# Patient Record
Sex: Female | Born: 1955 | Race: White | Hispanic: No | Marital: Married | Smoking: Never smoker
Health system: Southern US, Community
[De-identification: ages and names within clinical notes are randomized; demographics above are authoritative.]

## PROBLEM LIST (undated history)

## (undated) DIAGNOSIS — M779 Enthesopathy, unspecified: Secondary | ICD-10-CM

## (undated) DIAGNOSIS — S93409A Sprain of unspecified ligament of unspecified ankle, initial encounter: Secondary | ICD-10-CM

## (undated) DIAGNOSIS — G43909 Migraine, unspecified, not intractable, without status migrainosus: Secondary | ICD-10-CM

## (undated) DIAGNOSIS — C801 Malignant (primary) neoplasm, unspecified: Secondary | ICD-10-CM

## (undated) HISTORY — DX: Malignant (primary) neoplasm, unspecified: C80.1

## (undated) HISTORY — PX: OTHER SURGICAL HISTORY: SHX169

## (undated) HISTORY — DX: Enthesopathy, unspecified: M77.9

## (undated) HISTORY — DX: Migraine, unspecified, not intractable, without status migrainosus: G43.909

## (undated) HISTORY — DX: Sprain of unspecified ligament of unspecified ankle, initial encounter: S93.409A

---

## 2003-10-12 HISTORY — PX: MELANOMA EXCISION: SHX5266

## 2010-04-10 DEATH — deceased

## 2011-01-24 ENCOUNTER — Emergency Department (HOSPITAL_BASED_OUTPATIENT_CLINIC_OR_DEPARTMENT_OTHER)
Admission: EM | Admit: 2011-01-24 | Discharge: 2011-01-24 | Disposition: A | Discharge status: Home / Self Care | Payer: BC Managed Care – HMO | Attending: Emergency Medicine | Admitting: Emergency Medicine

## 2011-01-24 DIAGNOSIS — R21 Rash and other nonspecific skin eruption: Secondary | ICD-10-CM | POA: Insufficient documentation

## 2011-01-24 DIAGNOSIS — T462X5A Adverse effect of other antidysrhythmic drugs, initial encounter: Secondary | ICD-10-CM | POA: Insufficient documentation

## 2011-01-24 DIAGNOSIS — I959 Hypotension, unspecified: Secondary | ICD-10-CM | POA: Insufficient documentation

## 2013-01-05 ENCOUNTER — Encounter: Payer: Self-pay | Admitting: Podiatry

## 2013-01-16 ENCOUNTER — Ambulatory Visit: Payer: Self-pay | Admitting: Podiatry

## 2013-06-28 ENCOUNTER — Ambulatory Visit (INDEPENDENT_AMBULATORY_CARE_PROVIDER_SITE_OTHER): Payer: BC Managed Care – PPO | Admitting: Internal Medicine

## 2013-06-28 ENCOUNTER — Encounter: Payer: Self-pay | Admitting: Internal Medicine

## 2013-06-28 VITALS — BP 119/74 | HR 74 | Temp 96.8°F | Resp 18 | Wt 152.0 lb

## 2013-06-28 DIAGNOSIS — Z780 Asymptomatic menopausal state: Secondary | ICD-10-CM | POA: Insufficient documentation

## 2013-06-28 DIAGNOSIS — K635 Polyp of colon: Secondary | ICD-10-CM | POA: Insufficient documentation

## 2013-06-28 DIAGNOSIS — N951 Menopausal and female climacteric states: Secondary | ICD-10-CM

## 2013-06-28 DIAGNOSIS — C439 Malignant melanoma of skin, unspecified: Secondary | ICD-10-CM

## 2013-06-28 DIAGNOSIS — Z8249 Family history of ischemic heart disease and other diseases of the circulatory system: Secondary | ICD-10-CM

## 2013-06-28 DIAGNOSIS — D126 Benign neoplasm of colon, unspecified: Secondary | ICD-10-CM

## 2013-06-28 DIAGNOSIS — G4700 Insomnia, unspecified: Secondary | ICD-10-CM

## 2013-06-28 DIAGNOSIS — Z8669 Personal history of other diseases of the nervous system and sense organs: Secondary | ICD-10-CM | POA: Insufficient documentation

## 2013-06-28 DIAGNOSIS — Z90722 Acquired absence of ovaries, bilateral: Secondary | ICD-10-CM | POA: Insufficient documentation

## 2013-06-28 DIAGNOSIS — Z9079 Acquired absence of other genital organ(s): Secondary | ICD-10-CM

## 2013-06-28 DIAGNOSIS — Z800 Family history of malignant neoplasm of digestive organs: Secondary | ICD-10-CM

## 2013-06-28 LAB — LIPID PANEL
Cholesterol: 145 mg/dL (ref 0–200)
HDL: 58 mg/dL (ref >39)
Total CHOL/HDL Ratio: 2.5 Ratio
Triglycerides: 78 mg/dL (ref <150)

## 2013-06-28 LAB — COMPREHENSIVE METABOLIC PANEL
AST: 20 U/L (ref 0–37)
BUN: 18 mg/dL (ref 6–23)
CO2: 29 mEq/L (ref 19–32)
Calcium: 9.7 mg/dL (ref 8.4–10.5)
Chloride: 103 mEq/L (ref 96–112)
Creat: 0.92 mg/dL (ref 0.50–1.10)
Glucose, Bld: 75 mg/dL (ref 70–99)

## 2013-06-28 NOTE — Progress Notes (Signed)
Subjective:    Patient ID: Maria Hartman, female    DOB: September 01, 1956, 57 y.o.   MRN: [id number]  HPI Maria Hartman is a new pt here for first visit.  She is living in [place] for the next 3 years due to husbands work transfer Most care has been in [place]  PMH of Melanoma L leg, migraine headaches,  Colon polyps, and post menopause.  Sheis S/P bilateral oophorectomy at age [age].   She still has her uterus.    LMP age 68.  She has chronic insomnia and has been using ativan nightly for the past 3 years.    She is concerned about menopausal symptoms.  Three times in interview she states   "I just feel awful" She has sweats day and night, vaginal dryness,  Low libido and weight gain.    FH pos for ovarian cancer and colon cancer in paternal aunts,  Father had pancreatic cancer and paternal uncle had liver cancer.  PGF had blood clot and father was on Coumadin but not sure if he had a clot.  DAd had MI in his 64's.    No history of CVA.  No personal history of DVT or PE  She tells me she had a BRCA test in [place] but does not know results.  She has been on and off hormones for the past several years.  3 years ago she was given bioidentical cremes in [place].  " I didn't feel much better"   She has also seen [facility] in [place] where the PA gave her Vivelle dot .o5 mg and prometrium 100 mg daily .  She had very sore breasts and bloating sto stopped these.    No Known Allergies Past Medical History  Diagnosis Date  . Migraine   . Cancer    Past Surgical History  Procedure Laterality Date  . Fibroid tumors removed    . Ovaries removed      Both   History   Social History  . Marital Status: Married    Spouse Name: N/A    Number of Children: N/A  . Years of Education: N/A   Occupational History  . Not on file.   Social History Main Topics  . Smoking status: Never Smoker   . Smokeless tobacco: Never Used  . Alcohol Use: Not on file     Comment: weekly  . Drug Use: No  .  Sexual Activity: Yes    Birth Control/ Protection: Post-menopausal   Other Topics Concern  . Not on file   Social History Narrative  . No narrative on file   Family History  Problem Relation Age of Onset  . Gait disorder Mother   . Cancer Father     pancreas  . Stroke Maternal Grandmother    Patient Active Problem List   Diagnosis Date Noted  . Melanoma 06/28/2013  . Menopause 06/28/2013  . History of migraine 06/28/2013   No current outpatient prescriptions on file prior to visit.   No current facility-administered medications on file prior to visit.       Review of Systems See HPI    Objective:   Physical Exam  Physical Exam  Nursing note and vitals reviewed.  Constitutional: She is oriented to person, place, and time. She appears well-developed and well-nourished.  HENT:  Head: Normocephalic and atraumatic.  Cardiovascular: Normal rate and regular rhythm. Exam reveals no gallop and no friction rub.  No murmur heard.  Pulmonary/Chest: Breath sounds normal. She has  no wheezes. She has no rales.  Neurological: She is alert and oriented to person, place, and time.  Skin: Skin is warm and dry.  Psychiatric: She has a normal mood and affect. Her behavior is normal.    A Assessment & Plan:  Menopausal symptoms  Extensive counseling of risk/benefit of HT.  Also told HT may worsen migraine  Pt is to get results of bRCA   I will set her up to speak with genetic counselor at [facility].  Sheis to find out if her father had a DVT or PE  .  Sheis to get me her mm result.  She may be at too high risk of HT but may be a candidate for vaginal estrogen.    Luvena samples given advised cooling pillow until all info received.  Migraine headaches  Colon polyps  ?? Cancer family syndrome  Will send to genetic counselor

## 2013-06-28 NOTE — Patient Instructions (Addendum)
Talk to genetic counselor  I need mammogram results  Ask about father having a blood clot????  See me in 6 weeks

## 2013-06-29 ENCOUNTER — Telehealth: Payer: Self-pay | Admitting: Genetic Counselor

## 2013-06-29 LAB — CBC WITH DIFFERENTIAL/PLATELET
Basophils Absolute: 0.1 10*3/uL (ref 0.0–0.1)
Basophils Relative: 1 % (ref 0–1)
Eosinophils Absolute: 0.2 10*3/uL (ref 0.0–0.7)
Eosinophils Relative: 3 % (ref 0–5)
HCT: 39.5 % (ref 36.0–46.0)
Hemoglobin: 13.4 g/dL (ref 12.0–15.0)
Lymphocytes Relative: 27 % (ref 12–46)
Lymphs Abs: 1.7 10*3/uL (ref 0.7–4.0)
MCH: 30.2 pg (ref 26.0–34.0)
MCHC: 33.9 g/dL (ref 30.0–36.0)
MCV: 89 fL (ref 78.0–100.0)
Monocytes Absolute: 0.4 10*3/uL (ref 0.1–1.0)
Monocytes Relative: 6 % (ref 3–12)
Neutro Abs: 3.9 10*3/uL (ref 1.7–7.7)
Neutrophils Relative %: 63 % (ref 43–77)
Platelets: 191 10*3/uL (ref 150–400)
RBC: 4.44 MIL/uL (ref 3.87–5.11)
RDW: 13.5 % (ref 11.5–15.5)
WBC: 6.2 10*3/uL (ref 4.0–10.5)

## 2013-06-29 LAB — TSH: TSH: 1.92 u[IU]/mL (ref 0.350–4.500)

## 2013-06-29 NOTE — Telephone Encounter (Signed)
LVOM FOR PT TO RETURN CALL IN RE TO REFERRAL.  °

## 2013-07-09 ENCOUNTER — Encounter: Payer: Self-pay | Admitting: *Deleted

## 2013-07-13 ENCOUNTER — Ambulatory Visit (INDEPENDENT_AMBULATORY_CARE_PROVIDER_SITE_OTHER): Payer: BC Managed Care – PPO | Admitting: Podiatry

## 2013-07-13 ENCOUNTER — Encounter: Payer: Self-pay | Admitting: Podiatry

## 2013-07-13 ENCOUNTER — Ambulatory Visit: Payer: Self-pay | Admitting: Podiatry

## 2013-07-13 VITALS — BP 124/84 | HR 74 | Ht 67.0 in | Wt 150.0 lb

## 2013-07-13 DIAGNOSIS — M7741 Metatarsalgia, right foot: Secondary | ICD-10-CM

## 2013-07-13 DIAGNOSIS — M7750 Other enthesopathy of unspecified foot: Secondary | ICD-10-CM

## 2013-07-13 DIAGNOSIS — M722 Plantar fascial fibromatosis: Secondary | ICD-10-CM

## 2013-07-13 MED ORDER — ARCH BANDAGE MISC: 1.0000 | Freq: Every morning | Status: AC

## 2013-07-13 MED ORDER — ARCH BANDAGE MISC: 2.0000 | Freq: Every morning | Status: AC

## 2013-07-13 NOTE — Progress Notes (Signed)
57 year old female presents complaining of pain on both feet. She is wearing orthotics and very active with exercise.  Objective: Has high arched foot with unstable first ray bilateral. Pain on lateral column.  Assessment: Plantar fasciitis with unstable first ray bilateral.  Plan: reviewed findings. Metatarsal binder dispensed. Shoe selection discussed.

## 2013-07-13 NOTE — Patient Instructions (Addendum)
Seen for bilateral foot pain. Has weakened mid foot joint and unstable first Metatarsal bone. Will help with Metatarsal binder. Also adjusted orthotics. Return in 3 weeks.

## 2013-07-16 ENCOUNTER — Ambulatory Visit: Payer: Self-pay | Admitting: Podiatry

## 2013-07-21 ENCOUNTER — Encounter: Payer: Self-pay | Admitting: Internal Medicine

## 2013-07-21 DIAGNOSIS — N841 Polyp of cervix uteri: Secondary | ICD-10-CM | POA: Insufficient documentation

## 2013-07-21 DIAGNOSIS — N6011 Diffuse cystic mastopathy of right breast: Secondary | ICD-10-CM | POA: Insufficient documentation

## 2013-07-21 DIAGNOSIS — Z1371 Encounter for nonprocreative screening for genetic disease carrier status: Secondary | ICD-10-CM | POA: Insufficient documentation

## 2013-07-21 DIAGNOSIS — Z8041 Family history of malignant neoplasm of ovary: Secondary | ICD-10-CM | POA: Insufficient documentation

## 2013-07-24 ENCOUNTER — Telehealth: Payer: Self-pay | Admitting: Genetic Counselor

## 2013-07-24 NOTE — Telephone Encounter (Signed)
Left pt vm to return call in ref to genetic counseling

## 2013-07-31 ENCOUNTER — Telehealth: Payer: Self-pay | Admitting: Genetic Counselor

## 2013-07-31 NOTE — Telephone Encounter (Signed)
S/W PT IN REF TO GENETIC COUNS. ON 11/01/13@10 :00 MAILED NP PACKET

## 2013-08-03 ENCOUNTER — Encounter: Payer: Self-pay | Admitting: *Deleted

## 2013-08-03 ENCOUNTER — Ambulatory Visit: Payer: BC Managed Care – PPO | Admitting: Podiatry

## 2013-08-06 ENCOUNTER — Encounter: Payer: Self-pay | Admitting: *Deleted

## 2013-08-09 ENCOUNTER — Encounter: Payer: Self-pay | Admitting: *Deleted

## 2013-08-09 ENCOUNTER — Ambulatory Visit: Payer: BC Managed Care – PPO | Admitting: Internal Medicine

## 2013-10-30 ENCOUNTER — Telehealth: Payer: Self-pay | Admitting: Internal Medicine

## 2013-10-30 NOTE — Telephone Encounter (Signed)
pt called and wanted to cx genetics appt adn will call back to r/s....done

## 2013-11-01 ENCOUNTER — Other Ambulatory Visit: Payer: BC Managed Care – PPO

## 2013-11-01 ENCOUNTER — Encounter: Payer: BC Managed Care – PPO | Admitting: Genetic Counselor

## 2014-08-19 ENCOUNTER — Ambulatory Visit (INDEPENDENT_AMBULATORY_CARE_PROVIDER_SITE_OTHER): Payer: BC Managed Care – PPO | Admitting: Family Medicine

## 2014-08-19 ENCOUNTER — Other Ambulatory Visit: Payer: Self-pay | Admitting: Family Medicine

## 2014-08-19 ENCOUNTER — Encounter: Payer: Self-pay | Admitting: Family Medicine

## 2014-08-19 VITALS — BP 124/83 | HR 76 | Ht 68.0 in | Wt 150.0 lb

## 2014-08-19 DIAGNOSIS — M25569 Pain in unspecified knee: Secondary | ICD-10-CM

## 2014-08-19 DIAGNOSIS — S86899A Other injury of other muscle(s) and tendon(s) at lower leg level, unspecified leg, initial encounter: Secondary | ICD-10-CM

## 2014-08-19 NOTE — Patient Instructions (Signed)
You have shin splints (medial tibial stress syndrome) Take tylenol and/or aleve as needed for pain. Icing 3-4 times a day and after activity for 15 minutes at a time Use these orthotics as often as you can. Cross train with non-impact activities (cycling with low resistance, swimming). Theraband strengthening exercises 3 sets of 10 each direction; calf raises as well 3 sets of 10 once a day. Heel and toe walking exercises to strengthen muscles of lower leg. Get vitamin D level checked - we will call you with these results. Follow up with me in 6 weeks for reevaluation. Fill out release for the nerve conduction studies. Dermatologist I would recommend is Crista Luria MD The neurology group we typically use is [facility].

## 2014-08-20 DIAGNOSIS — M79605 Pain in left leg: Secondary | ICD-10-CM

## 2014-08-20 DIAGNOSIS — M79604 Pain in right leg: Secondary | ICD-10-CM | POA: Insufficient documentation

## 2014-08-20 LAB — VITAMIN D 25 HYDROXY (VIT D DEFICIENCY, FRACTURES): Vit D, 25-Hydroxy: 55 ng/mL (ref 30–89)

## 2014-08-20 NOTE — Assessment & Plan Note (Signed)
Patient has tried a lot of conservative measures to date though feel the diagnosis is accurate of shin splints.  No evidence stress fracture.  Will check Vitamin D level.  Reviewed home strengthening program to do daily and encouraged this.  Icing, tylenol or nsaids if needed.  Uses orthotics with good arch support - continue these.  F/u in 6 weeks.

## 2014-08-20 NOTE — Progress Notes (Addendum)
PCP: Idamae Schuller, MD  Subjective:   HPI: Patient is a 58 y.o. female here for bilateral shin pain.  Patient reports issues started about 3 years ago when she was in bathroom seated for a long time. Went to get up and legs were numb, sprained both of her ankles. Since then has struggled with tendinitis of both ankles - did extensive PT for this and ankle instability. Reports she gets pain now in both shins anteriorly (not necessarily medial shins) when running, riding bike for more than a mile, or with aerobics. No history of stress fracture. No focal pain. No pain at rest.  Past Medical History  Diagnosis Date  . Migraine   . Cancer     Current Outpatient Prescriptions on File Prior to Visit  Medication Sig Dispense Refill  . B Complex-C (B-COMPLEX WITH VITAMIN C) tablet Take 1 tablet by mouth daily.    Water engineer Bandages & Supports [facility] BANDAGE) MISC 1 each by Does not apply route every morning. 2 each 0  . fish oil-omega-3 fatty acids 1000 MG capsule Take 2 g by mouth daily.    . fluticasone (FLONASE) 50 MCG/ACT nasal spray     . LORazepam (ATIVAN) 0.5 MG tablet Take 0.5 mg by mouth Nightly.    . Misc Natural Products (GLUCOSAMINE CHOND COMPLEX/MSM PO) Take 1 tablet by mouth daily.    . MULTIPLE MINERALS-VITAMINS PO Take 1 tablet by mouth daily.    Marland Kitchen zolmitriptan (ZOMIG) 5 MG tablet Take 5 mg by mouth as needed for migraine. Pt cuts tablet in half     Current Facility-Administered Medications on File Prior to Visit  Medication Dose Route Frequency Provider Last Rate Last Dose  . Arch Bandage MISC 2 each  2 each Does not apply q morning - 10a Myeong Sheard, DPM        Past Surgical History  Procedure Laterality Date  . Fibroid tumors removed    . Ovaries removed      Both    Allergies  Allergen Reactions  . Propranolol     History   Social History  . Marital Status: Married    Spouse Name: N/A    Number of Children: N/A  . Years of Education: N/A    Occupational History  . Not on file.   Social History Main Topics  . Smoking status: Never Smoker   . Smokeless tobacco: Never Used  . Alcohol Use: Not on file     Comment: weekly  . Drug Use: No  . Sexual Activity: Yes    Birth Control/ Protection: Post-menopausal   Other Topics Concern  . Not on file   Social History Narrative    Family History  Problem Relation Age of Onset  . Gait disorder Mother   . Cancer Father     pancreas  . Stroke Maternal Grandmother     BP 124/83 mmHg  Pulse 76  Ht 5\' 8"  (1.727 m)  Wt 150 lb (68.04 kg)  BMI 22.81 kg/m2  LMP 06/28/2006  Review of Systems: See HPI above.    Objective:  Physical Exam:  Gen: NAD  Bilateral lower legs: No gross deformity, swelling, bruising. Bilateral longitudinal arches well maintained. No focal tenderness to palpation currently. FROM ankles with 5/5 strength, no pain. Negative hop test and fulcrum test. NVI distally.  MSK u/s:  No neovascularity, cortical irregularity, edema overlying cortex of bilateral tibias.    Assessment & Plan:  1. Bilateral shin splints - Patient has tried  a lot of conservative measures to date though feel the diagnosis is accurate of shin splints.  No evidence stress fracture.  Will check Vitamin D level.  Reviewed home strengthening program to do daily and encouraged this.  Icing, tylenol or nsaids if needed.  Uses orthotics with good arch support - continue these.  F/u in 6 weeks.  Addendum:  Patients records obtained from [facility].  Her NCVs on 04/01/14 were normal - right tibial motor was low normal.  They made note that she may benefit from evaluation by neurology to rule out a small fiber neuropathy - discussed with patient and we will consider this in the future.

## 2014-09-12 ENCOUNTER — Ambulatory Visit (INDEPENDENT_AMBULATORY_CARE_PROVIDER_SITE_OTHER): Payer: BC Managed Care – PPO | Admitting: Family Medicine

## 2014-09-12 ENCOUNTER — Encounter: Payer: Self-pay | Admitting: Family Medicine

## 2014-09-12 ENCOUNTER — Ambulatory Visit (HOSPITAL_BASED_OUTPATIENT_CLINIC_OR_DEPARTMENT_OTHER)
Admission: RE | Admit: 2014-09-12 | Discharge: 2014-09-12 | Disposition: A | Discharge status: Home / Self Care | Payer: BC Managed Care – PPO | Source: Ambulatory Visit | Attending: Family Medicine | Admitting: Family Medicine

## 2014-09-12 VITALS — BP 113/81 | HR 77 | Ht 68.0 in | Wt 160.0 lb

## 2014-09-12 DIAGNOSIS — M79605 Pain in left leg: Secondary | ICD-10-CM | POA: Insufficient documentation

## 2014-09-12 DIAGNOSIS — M79604 Pain in right leg: Secondary | ICD-10-CM

## 2014-09-12 NOTE — Patient Instructions (Signed)
We will go ahead with an MRI of your lumbar spine to assess for spinal stenosis. Get x-rays as you leave today of your low back - we will contact you with these results too. I will call you the business day following your MRI to go over results and next steps. We will set up a neurology appointment as well - we can cancel this if your MRI shows the cause of the problem.

## 2014-09-14 ENCOUNTER — Ambulatory Visit (HOSPITAL_BASED_OUTPATIENT_CLINIC_OR_DEPARTMENT_OTHER)
Admission: RE | Admit: 2014-09-14 | Discharge: 2014-09-14 | Disposition: A | Discharge status: Home / Self Care | Payer: BC Managed Care – PPO | Source: Ambulatory Visit | Attending: Family Medicine | Admitting: Family Medicine

## 2014-09-14 DIAGNOSIS — M79604 Pain in right leg: Secondary | ICD-10-CM | POA: Insufficient documentation

## 2014-09-14 DIAGNOSIS — M79605 Pain in left leg: Secondary | ICD-10-CM | POA: Diagnosis not present

## 2014-09-16 NOTE — Assessment & Plan Note (Signed)
Patient continues to struggle with severe pain despite relative rest, icing, regular advil, arch supports, and doing home exercises.  She doesn't have medial shin tenderness though suspected previously this was an unusual presentation of shin splints.  She's had NCVs/EMGs in past that were negative though they recommended neurology evaluation if her leg pain persisted.  Advised sometimes spinal stenosis can present similarly and we should go ahead with lumbar spine MRI to assess.  If this is normal would have her see neurology for evaluation.

## 2014-09-16 NOTE — Progress Notes (Signed)
PCP: Idamae Schuller, MD  Subjective:   HPI: Patient is a 58 y.o. female here for bilateral shin pain.  11/9: Patient reports issues started about 3 years ago when she was in bathroom seated for a long time. Went to get up and legs were numb, sprained both of her ankles. Since then has struggled with tendinitis of both ankles - did extensive PT for this and ankle instability. Reports she gets pain now in both shins anteriorly (not necessarily medial shins) when running, riding bike for more than a mile, or with aerobics. No history of stress fracture. No focal pain. No pain at rest.  12/3: Patient reports she continues to have fairly severe pain into both legs below the knee. Painful to walk, legs feel weak. Taking advil regularly. Worse standing as well. Tried elevating. Using orthotics with good arch support and doing home exercises.  Past Medical History  Diagnosis Date  . Migraine   . Cancer     Current Outpatient Prescriptions on File Prior to Visit  Medication Sig Dispense Refill  . B Complex-C (B-COMPLEX WITH VITAMIN C) tablet Take 1 tablet by mouth daily.    Water engineer Bandages & Supports [facility] BANDAGE) MISC 1 each by Does not apply route every morning. 2 each 0  . fish oil-omega-3 fatty acids 1000 MG capsule Take 2 g by mouth daily.    . fluticasone (FLONASE) 50 MCG/ACT nasal spray     . LORazepam (ATIVAN) 0.5 MG tablet Take 0.5 mg by mouth Nightly.    . Misc Natural Products (GLUCOSAMINE CHOND COMPLEX/MSM PO) Take 1 tablet by mouth daily.    . MULTIPLE MINERALS-VITAMINS PO Take 1 tablet by mouth daily.    Marland Kitchen zolmitriptan (ZOMIG) 5 MG tablet Take 5 mg by mouth as needed for migraine. Pt cuts tablet in half     Current Facility-Administered Medications on File Prior to Visit  Medication Dose Route Frequency Provider Last Rate Last Dose  . Arch Bandage MISC 2 each  2 each Does not apply q morning - 10a Myeong Sheard, DPM        Past Surgical History  Procedure  Laterality Date  . Fibroid tumors removed    . Ovaries removed      Both    Allergies  Allergen Reactions  . Propranolol     History   Social History  . Marital Status: Married    Spouse Name: N/A    Number of Children: N/A  . Years of Education: N/A   Occupational History  . Not on file.   Social History Main Topics  . Smoking status: Never Smoker   . Smokeless tobacco: Never Used  . Alcohol Use: Not on file     Comment: weekly  . Drug Use: No  . Sexual Activity: Yes    Birth Control/ Protection: Post-menopausal   Other Topics Concern  . Not on file   Social History Narrative    Family History  Problem Relation Age of Onset  . Gait disorder Mother   . Cancer Father     pancreas  . Stroke Maternal Grandmother     BP 113/81 mmHg  Pulse 77  Ht 5\' 8"  (1.727 m)  Wt 160 lb (72.576 kg)  BMI 24.33 kg/m2  LMP 06/28/2006  Review of Systems: See HPI above.    Objective:  Physical Exam:  Gen: NAD  Bilateral lower legs: No gross deformity, swelling, bruising. Bilateral longitudinal arches well maintained. No focal tenderness to palpation currently. FROM  ankles with 5/5 strength, no pain. NVI distally.  Back: No back tenderness.   FROM without pain.  Assessment & Plan:  1. Bilateral leg pain - Patient continues to struggle with severe pain despite relative rest, icing, regular advil, arch supports, and doing home exercises.  She doesn't have medial shin tenderness though suspected previously this was an unusual presentation of shin splints.  She's had NCVs/EMGs in past that were negative though they recommended neurology evaluation if her leg pain persisted.  Advised sometimes spinal stenosis can present similarly and we should go ahead with lumbar spine MRI to assess.  If this is normal would have her see neurology for evaluation.   Addendum:  MRI reviewed - no evidence of spinal stenosis.  Will have patient see neurology for evaluation.

## 2014-09-18 NOTE — Addendum Note (Signed)
Addended by: Sherrie George F on: 09/18/2014 03:27 PM   Modules accepted: Orders

## 2014-09-20 ENCOUNTER — Telehealth: Payer: Self-pay | Admitting: Family Medicine

## 2014-09-20 NOTE — Telephone Encounter (Signed)
Spoke to neurology office and then called patient to let them know the neurology office would be contacting them today (09-20-14) to schedule appointment.

## 2014-09-25 ENCOUNTER — Encounter: Payer: Self-pay | Admitting: Diagnostic Neuroimaging

## 2014-09-25 ENCOUNTER — Ambulatory Visit (INDEPENDENT_AMBULATORY_CARE_PROVIDER_SITE_OTHER): Payer: BC Managed Care – PPO | Admitting: Diagnostic Neuroimaging

## 2014-09-25 VITALS — BP 128/84 | HR 83 | Temp 98.1°F | Ht 67.75 in | Wt 155.6 lb

## 2014-09-25 DIAGNOSIS — M25579 Pain in unspecified ankle and joints of unspecified foot: Secondary | ICD-10-CM

## 2014-09-25 NOTE — Patient Instructions (Signed)
Continue physical therapy exercises.

## 2014-09-25 NOTE — Progress Notes (Signed)
[facility]  PATIENT: Maria Hartman DOB: 03/17/1956  REFERRING CLINICIAN: Hudnall HISTORY FROM: patient  REASON FOR VISIT: new consult    HISTORICAL  CHIEF COMPLAINT:  Chief Complaint  Patient presents with  . Leg Pain    HISTORY OF PRESENT ILLNESS:   58 year old right-handed female here for evaluation of bilateral foot and ankle pain. 3 years ago patient was sitting on the commode for 45 minutes, reading, when her legs began to go numb. Patient stood up she lost her balance and fell down. Patient had significant sprain of her bilateral ankles. Her feet turned black and blue. Patient was evaluated at the hospital, prescribed exercises for recovery. Within 4-6 weeks she had 90% recovery. Patient went on a cruise vacation, and she had to cut this trip short because of increased pain, swelling in her feet. Patient went to [facility] orthopedic clinic and was diagnosed with tendinitis. She was prescribed additional physical therapy and her symptoms gradually improved.  Over the past 1-2 years, patient's symptoms have gradually worsened. Now she has some intermittent electrical pain shooting in her ankle and feet. Patient has some pain in her anterior tibial region. Patient was advised to change her shoes, try shoe inserts, tried meloxicam, and additional physical therapy, without relief. Patient has intermittent symptoms currently. Symptoms are much worse with activity such as standing for a long time, walking long distances. Patient was previously very physically active, but she is not able to do this anymore.    REVIEW OF SYSTEMS: Full 14 system review of systems performed and notable only for joint pain aching muscles fatigue.  ALLERGIES: Allergies  Allergen Reactions  . Propranolol Hives    HOME MEDICATIONS: Outpatient Prescriptions Prior to Visit  Medication Sig Dispense Refill  . Elastic Bandages & Supports [facility] BANDAGE) MISC 1 each by Does not apply route every  morning. 2 each 0  . fish oil-omega-3 fatty acids 1000 MG capsule Take 2 g by mouth daily.    Marland Kitchen LORazepam (ATIVAN) 0.5 MG tablet Take 0.5 mg by mouth Nightly.    . Misc Natural Products (GLUCOSAMINE CHOND COMPLEX/MSM PO) Take 1 tablet by mouth daily.    . MULTIPLE MINERALS-VITAMINS PO Take 1 tablet by mouth daily as needed.     . zolmitriptan (ZOMIG) 5 MG tablet Take 5 mg by mouth as needed for migraine. Pt cuts tablet in half    . B Complex-C (B-COMPLEX WITH VITAMIN C) tablet Take 1 tablet by mouth daily.    . fluticasone (FLONASE) 50 MCG/ACT nasal spray      Facility-Administered Medications Prior to Visit  Medication Dose Route Frequency Provider Last Rate Last Dose  . Arch Bandage MISC 2 each  2 each Does not apply q morning - 10a Myeong Sheard, DPM        PAST MEDICAL HISTORY: Past Medical History  Diagnosis Date  . Migraine   . Cancer   . Tendonitis   . Sprain of ankle   . Migraine     PAST SURGICAL HISTORY: Past Surgical History  Procedure Laterality Date  . Fibroid tumors removed    . Ovaries removed      Both  . Melanoma excision Left 2005    FAMILY HISTORY: Family History  Problem Relation Age of Onset  . Gait disorder Mother     spinal surgery at age 69  . Cancer Father     pancreas  . Stroke Maternal Grandmother     SOCIAL HISTORY:  History  Social History  . Marital Status: Married    Spouse Name: Liliane Channel    Number of Children: 0  . Years of Education: College   Occupational History  . Retired Other   Social History Main Topics  . Smoking status: Never Smoker   . Smokeless tobacco: Never Used  . Alcohol Use: Not on file     Comment: weekly  . Drug Use: No  . Sexual Activity: Yes    Birth Control/ Protection: Post-menopausal   Other Topics Concern  . Not on file   Social History Narrative   Patient lives at home with her spouse.   Caffeine use: 2-3 cups daily   2 adopted children     PHYSICAL EXAM  Filed Vitals:   09/25/14 0924    BP: 128/84  Pulse: 83  Temp: 98.1 F (36.7 C)  TempSrc: Oral  Height: 5' 7.75" (1.721 m)  Weight: 155 lb 9.6 oz (70.58 kg)    Body mass index is 23.83 kg/(m^2).   Visual Acuity Screening   Right eye Left eye Both eyes  Without correction:     With correction: 20/40 20/30     No flowsheet data found.  GENERAL EXAM: Patient is in no distress; well developed, nourished and groomed; neck is supple  CARDIOVASCULAR: Regular rate and rhythm, no murmurs, no carotid bruits  NEUROLOGIC: MENTAL STATUS: awake, alert, oriented to person, place and time, recent and remote memory intact, normal attention and concentration, language fluent, comprehension intact, naming intact, fund of knowledge appropriate CRANIAL NERVE: no papilledema on fundoscopic exam, pupils equal and reactive to light, visual fields full to confrontation, extraocular muscles intact, no nystagmus, facial sensation and strength symmetric, hearing intact, palate elevates symmetrically, uvula midline, shoulder shrug symmetric, tongue midline. MOTOR: normal bulk and tone, full strength in the BUE, BLE SENSORY: normal and symmetric to light touch, pinprick, temperature, vibration and proprioception COORDINATION: finger-nose-finger, fine finger movements normal REFLEXES: deep tendon reflexes present and symmetric; ANKLES TRACE; 1+ WITH REINFORCEMENT OF HANDS GAIT/STATION: narrow based gait; able to walk on toes, heels; romberg is negative    DIAGNOSTIC DATA (LABS, IMAGING, TESTING) - I reviewed patient records, labs, notes, testing and imaging myself where available.  Lab Results  Component Value Date   WBC 6.2 06/28/2013   HGB 13.4 06/28/2013   HCT 39.5 06/28/2013   MCV 89.0 06/28/2013   PLT 191 06/28/2013      Component Value Date/Time   NA 140 06/28/2013 1531   K 3.9 06/28/2013 1531   CL 103 06/28/2013 1531   CO2 29 06/28/2013 1531   GLUCOSE 75 06/28/2013 1531   BUN 18 06/28/2013 1531   CREATININE 0.92  06/28/2013 1531   CALCIUM 9.7 06/28/2013 1531   PROT 6.8 06/28/2013 1531   ALBUMIN 4.6 06/28/2013 1531   AST 20 06/28/2013 1531   ALT 22 06/28/2013 1531   ALKPHOS 42 06/28/2013 1531   BILITOT 0.4 06/28/2013 1531   Lab Results  Component Value Date   CHOL 145 06/28/2013   HDL 58 06/28/2013   LDLCALC 71 06/28/2013   TRIG 78 06/28/2013   CHOLHDL 2.5 06/28/2013   No results found for: HGBA1C No results found for: VITAMINB12 Lab Results  Component Value Date   TSH 1.920 06/28/2013   I reviewed images myself and agree with interpretation. -VRP  09/14/14 MRI lumbar spine - No significant lumbar spine disc protrusion, foraminal stenosis or central canal stenosis.   ASSESSMENT AND PLAN  58 y.o. year old female  here with intermittent lower extremity pain in feet, ankles, shins, ever since accident 3 years ago patient fell down and sprained both ankles. Neurologic examination is unremarkable. EMG nerve conduction study and lumbar spine MRI have been unremarkable. I do not think patient has large or small fiber neuropathy, so no further neurologic testing advised. I think patient's symptoms are more musculoskeletal in etiology.  PLAN: - monitor symptoms, continue PT exercises, consider NSAIDs or other pain mgmt clinic, podiatry or rheumatology evaluation  Return if symptoms worsen or fail to improve, for return to Dr. Barbaraann Barthel.    Penni Bombard, MD 16/07/9603, 54:09 AM Certified in Neurology, Neurophysiology and Neuroimaging  [facility] [address], [place], [place] [address] [phone]

## 2014-10-08 ENCOUNTER — Ambulatory Visit (INDEPENDENT_AMBULATORY_CARE_PROVIDER_SITE_OTHER): Payer: BC Managed Care – PPO

## 2014-10-08 ENCOUNTER — Ambulatory Visit: Payer: BC Managed Care – PPO

## 2014-10-08 VITALS — BP 153/99 | HR 77 | Resp 18

## 2014-10-08 DIAGNOSIS — G894 Chronic pain syndrome: Secondary | ICD-10-CM

## 2014-10-08 DIAGNOSIS — R52 Pain, unspecified: Secondary | ICD-10-CM

## 2014-10-08 NOTE — Progress Notes (Signed)
   Subjective:    Patient ID: Maria Hartman, female    DOB: Feb 10, 1956, 58 y.o.   MRN: [id number]  HPI BACK ON SEPT 2012 I WAS ON THE TOILET AND WAS ON MY IPAD AND BOTH MY LEGS WENT TO SLEEP AND I TRIED TO GET UP AND I FELL AND HAD NO FEELING IN MY LEGS AND I USED TO WORKOUT AND WENT TO THE ER ON THAT DAY AND THEY DID X-RAYS AND CAN'T WALK FOR A LONG PERIOD OF TIME AND I HAD MRI AND ULTRASOUNDS DONE AND NERVE CONDITION STUDIES DONE AND NO BODY CAN TELL ME WHAT IS WRONG WITH MY LEGS AND FEET AND I HAVE GONE TO PHYSICAL THERAPY AND MY FEET FEEL INFLAMMED AND I WENT TO [facility] AND THEY TELL ME I HAVE TENDONITIS AND THEY SAY I HAVE SEVERE SHIN SPLINTS     Review of Systems  Constitutional: Positive for fatigue.  Musculoskeletal: Positive for gait problem.       JOINT AND MUSCLE PAIN       Objective:   Physical Exam 58 year old white female well-developed well-nourished oriented 3 presents at this time with a description of pain to both legs having problems with shin splints in the past having pain in her feet and legs currently no pain refusal pain in her feet are noted. Has normal range of motion pedal pulses are palpable DP and PT +2 over 4 Refill time 3 seconds epicritic and proprioceptive sensations intact and symmetric bilateral DTRs intact and symmetric normal plantar response noted. X-rays reveal normal foot structure is no significant arthrosis rectus foot mild flexible digital contractures noted clinically and radiographically no open wounds no ulcers no secondary infections there is some history of leg pain although not reducible at the current time as well patient describes his pain all the time not is a fairly paresthesia not burning or stinging although different types of pain have been felt. Patient has had ultrasound evaluations has had x-rays nerve conduction studies all of which have been inconclusive current x-rays taken today again inconclusive no abnormalities identified no abnormalities  the foot are identified at this time.       Assessment & Plan:  Assessment this time patient may have chronic regional pain syndrome or chronic pain type syndrome or fibromyalgia suggested that she have a neurologic evaluation and possible referral for pain management at some point. I do not feel there is any foot issue at this time from biomechanical or structural standpoint no foot abnormalities are noted are reproduced at this time her symptoms are actually in her leg at the current time most recent time in her lower legs and not at her feet all there is no refusal pain on palpation range of motion are examined of the either foot at this visit. However patient describes having pain at different times intermittent in a variety of natures of pain is far is sharper burning or aching. Again this may be phantom pain of some sort I cannot see any biomechanical fault in the foot will refer for neurologic evaluation she has been seen by a neurologist recently moved here from [place] however I suggested she resume contact with a neurologist here in [place] and further follow-up of likely pain management.  Harriet Masson DPM

## 2014-10-08 NOTE — Addendum Note (Signed)
Addended by: Clovis Riley E on: 10/08/2014 03:16 PM   Modules accepted: Orders

## 2014-10-09 ENCOUNTER — Telehealth: Payer: Self-pay | Admitting: *Deleted

## 2014-10-09 NOTE — Telephone Encounter (Signed)
Referral sent for a neurologic consult.  Demographics, history and physical form and clinical notes were faxed.

## 2014-10-14 ENCOUNTER — Telehealth: Payer: Self-pay | Admitting: *Deleted

## 2014-10-14 NOTE — Telephone Encounter (Signed)
Alver Sorrow states a 2nd referral request had been received in their office, but pt had been seen in office 09/25/2014.  Is there a question or is this a new problem referral?

## 2014-10-15 ENCOUNTER — Ambulatory Visit: Payer: BC Managed Care – PPO

## 2014-10-22 ENCOUNTER — Ambulatory Visit: Payer: BC Managed Care – PPO

## 2014-10-29 ENCOUNTER — Telehealth: Payer: Self-pay | Admitting: *Deleted

## 2014-10-29 NOTE — Telephone Encounter (Signed)
Para Lahey her neurologist hasn't seen her for this in December and there is nothing else they can offer her. The recommendation was that she be sent to a pain management clinic. Contact patient indicated to her that pain management may be her only option at this time, neurology did not seem to have anything else to offer. Help patient make arrangements to see a pain management specialist for likely fibromyalgia or chronic pain symptoms or RSD. Next  Maria Hartman DPM

## 2014-10-29 NOTE — Telephone Encounter (Signed)
"  Dr. Leta Baptist said he saw her December 16 for the same problem.  He said there really nothing else he can do for her.  She may need to be referred to pain clinic."

## 2014-11-06 ENCOUNTER — Other Ambulatory Visit: Payer: Self-pay | Admitting: *Deleted

## 2014-11-06 ENCOUNTER — Telehealth: Payer: Self-pay | Admitting: *Deleted

## 2014-11-06 DIAGNOSIS — G894 Chronic pain syndrome: Secondary | ICD-10-CM

## 2014-11-06 NOTE — Telephone Encounter (Signed)
I left the patient a message to call me. I need to inform her about referral to pain management per Dr. Blenda Mounts.  [facility] said there is no other treatment they can perform suggested pain management.  I [facility] order to [facility].  Their address is [address]  Phone number is [phone].

## 2016-01-22 IMAGING — CR DG LUMBAR SPINE 2-3V
3 series · 3 of 3 positions shown · non-contrast
Comparison: None.

CLINICAL DATA: Bilateral lower leg pain, no acute injury

EXAM:
LUMBAR SPINE - 2-3 VIEW

[t l-spine a.p.]
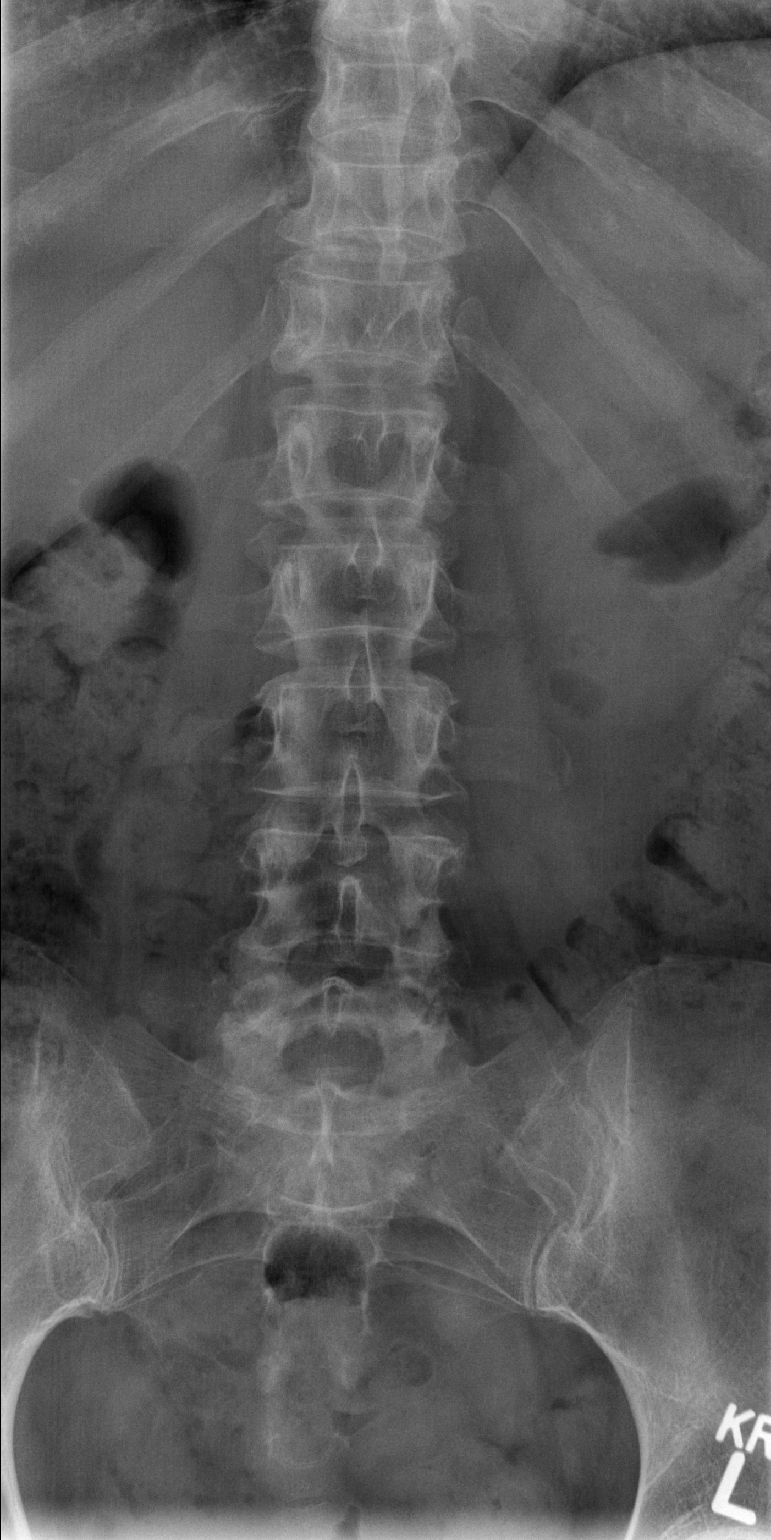

[t l-spine lat]
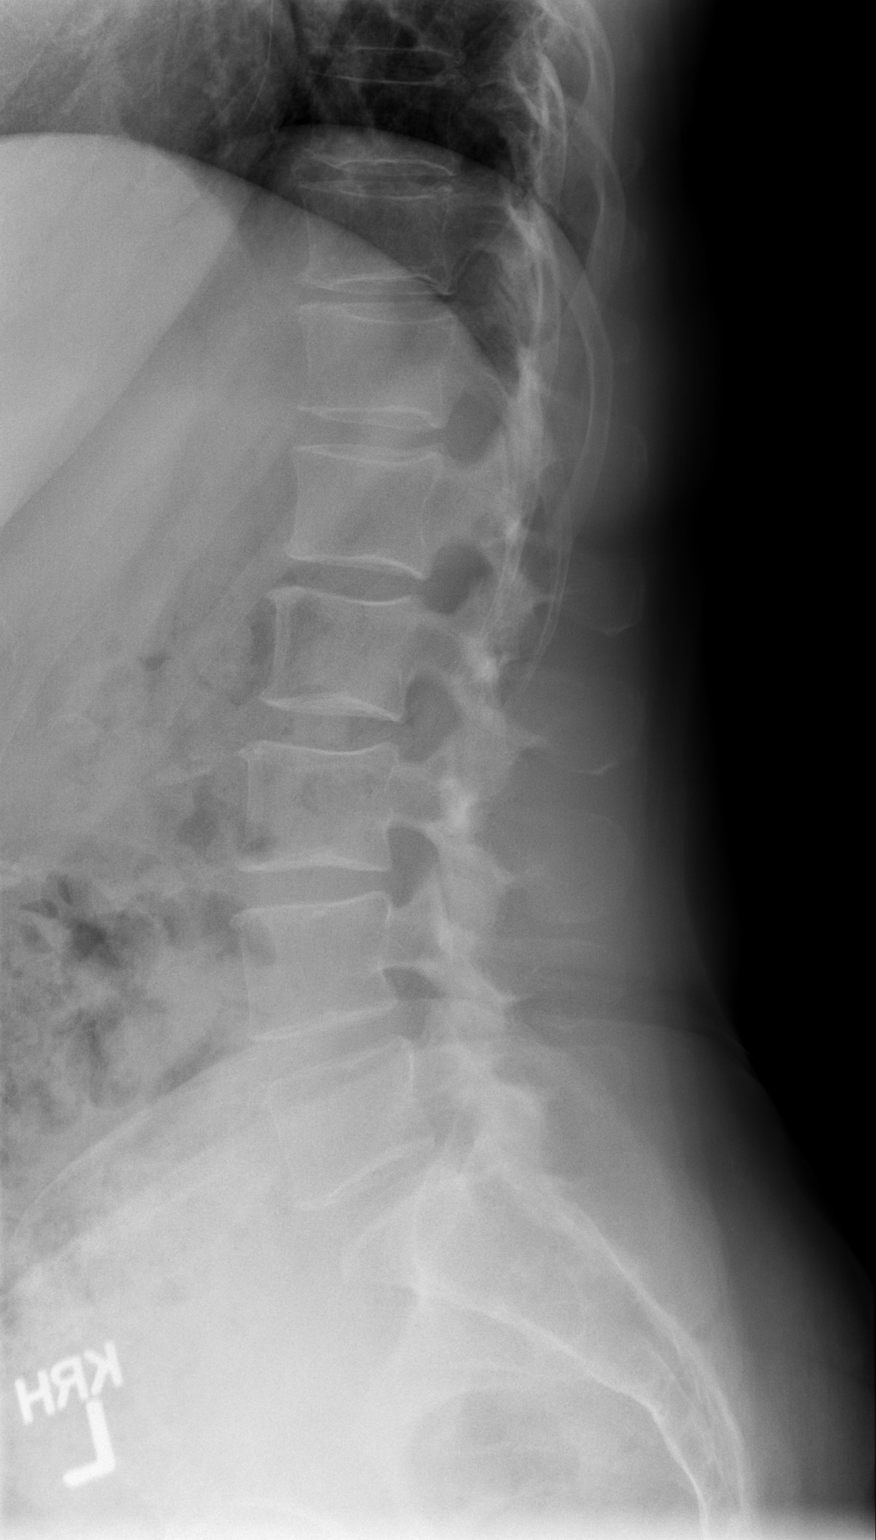

[t l-spine l5-s1 spot]
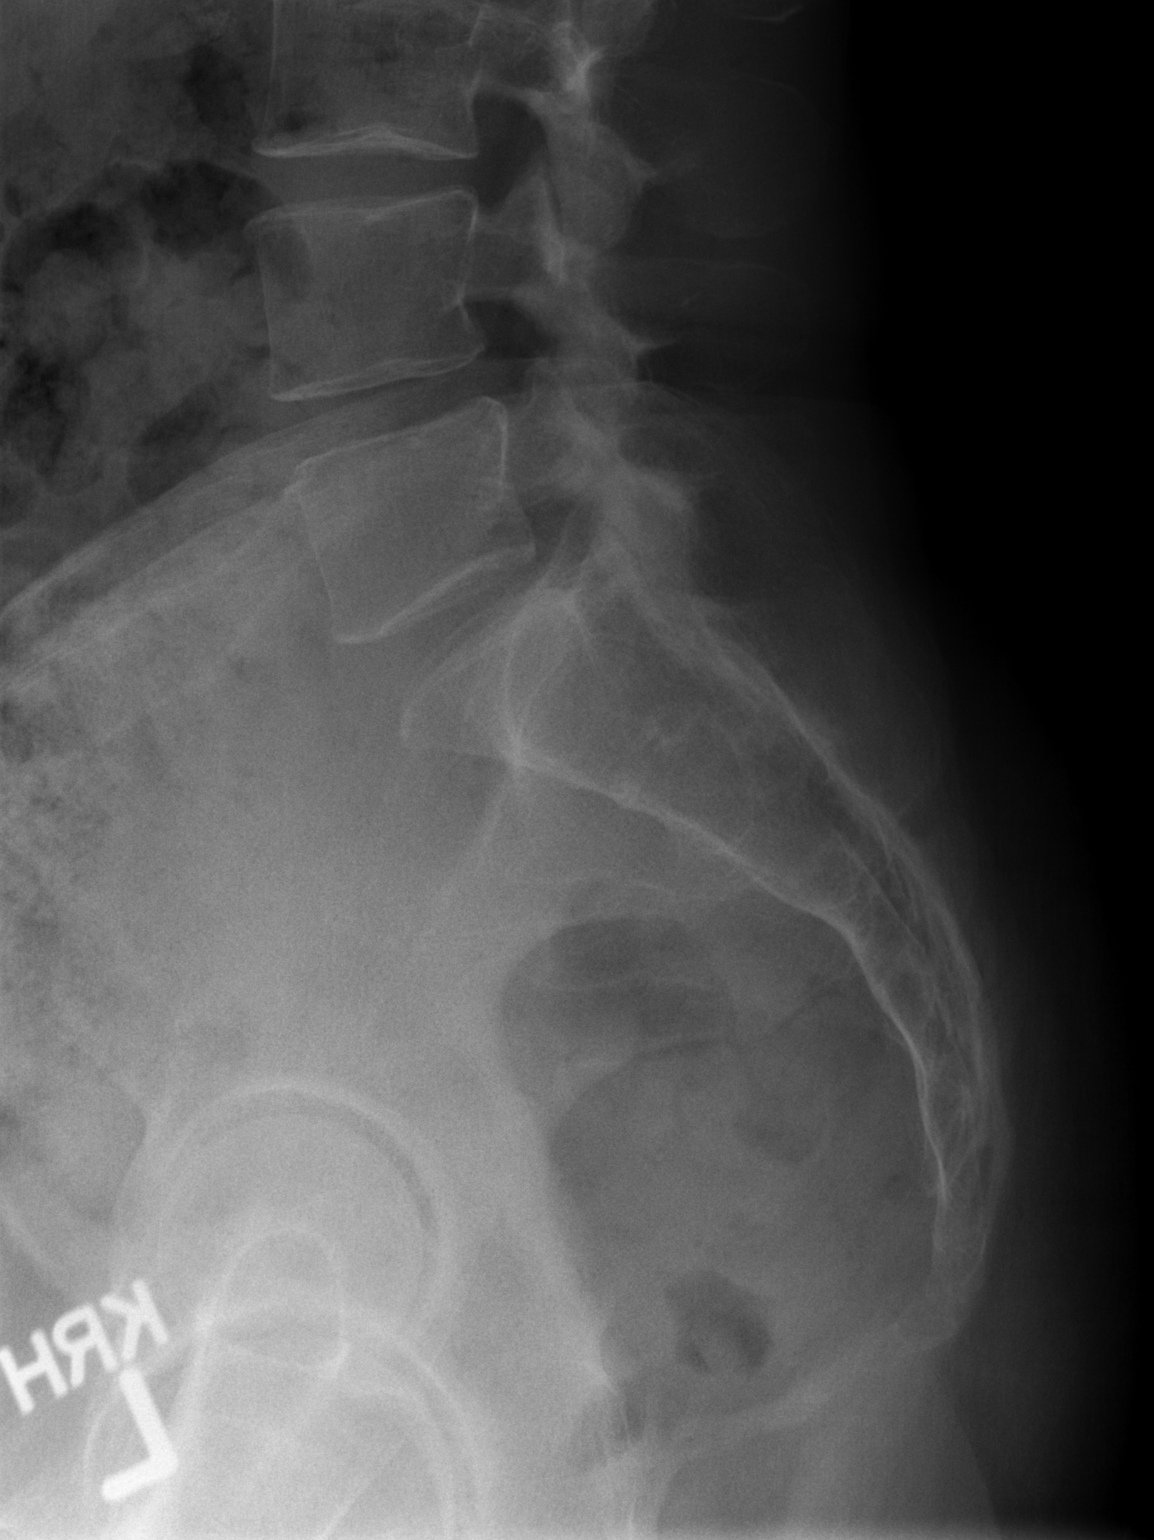

[3 of 3 positions shown; findings below may reference images not displayed]

FINDINGS: The lumbar vertebrae are in normal alignment. Intervertebral disc
spaces appear normal. No compression deformity is seen. The SI
joints appear corticated.
IMPRESSION: Normal alignment.  Normal intervertebral disc spaces.

## 2016-02-09 DEATH — deceased
